# Patient Record
Sex: Male | Born: 1985 | Race: White | Hispanic: No | Marital: Single | Smoking: Current every day smoker
Health system: Southern US, Community
[De-identification: ages and names within clinical notes are randomized; demographics above are authoritative.]

## PROBLEM LIST (undated history)

## (undated) DIAGNOSIS — J45909 Unspecified asthma, uncomplicated: Secondary | ICD-10-CM

## (undated) DIAGNOSIS — W3400XA Accidental discharge from unspecified firearms or gun, initial encounter: Secondary | ICD-10-CM

## (undated) HISTORY — PX: FINGER SURGERY: SHX640

---
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
## 2006-05-28 ENCOUNTER — Emergency Department (HOSPITAL_COMMUNITY): Admission: EM | Admit: 2006-05-28 | Discharge: 2006-05-28 | Payer: Self-pay | Admitting: Emergency Medicine

## 2006-05-31 ENCOUNTER — Emergency Department (HOSPITAL_COMMUNITY): Admission: EM | Admit: 2006-05-31 | Discharge: 2006-05-31 | Payer: Self-pay | Admitting: Emergency Medicine

## 2006-07-20 ENCOUNTER — Emergency Department (HOSPITAL_COMMUNITY): Admission: EM | Admit: 2006-07-20 | Discharge: 2006-07-20 | Payer: Self-pay | Admitting: Emergency Medicine

## 2006-07-22 ENCOUNTER — Emergency Department (HOSPITAL_COMMUNITY): Admission: EM | Admit: 2006-07-22 | Discharge: 2006-07-22 | Payer: Self-pay | Admitting: Family Medicine

## 2006-07-22 ENCOUNTER — Emergency Department (HOSPITAL_COMMUNITY): Admission: EM | Admit: 2006-07-22 | Discharge: 2006-07-22 | Payer: Self-pay | Admitting: Emergency Medicine

## 2007-07-17 ENCOUNTER — Emergency Department (HOSPITAL_COMMUNITY): Admission: EM | Admit: 2007-07-17 | Discharge: 2007-07-18 | Payer: Self-pay | Admitting: Emergency Medicine

## 2007-11-23 ENCOUNTER — Emergency Department (HOSPITAL_COMMUNITY): Admission: EM | Admit: 2007-11-23 | Discharge: 2007-11-23 | Payer: Self-pay | Admitting: Family Medicine

## 2008-01-17 ENCOUNTER — Emergency Department (HOSPITAL_COMMUNITY): Admission: EM | Admit: 2008-01-17 | Discharge: 2008-01-17 | Payer: Self-pay | Admitting: Family Medicine

## 2008-05-16 ENCOUNTER — Emergency Department (HOSPITAL_COMMUNITY): Admission: EM | Admit: 2008-05-16 | Discharge: 2008-05-17 | Payer: Self-pay | Admitting: Emergency Medicine

## 2010-08-19 ENCOUNTER — Emergency Department: Payer: Self-pay | Admitting: Unknown Physician Specialty

## 2010-08-26 ENCOUNTER — Emergency Department: Payer: Self-pay | Admitting: Emergency Medicine

## 2010-11-13 NOTE — Op Note (Signed)
[facility]  Patient:    Frank Mann, Frank Mann Visit Number: [id number] MRN: [id number]          Service Type: DSU Location: [facility] Attending Physician:  Ronne Binning Dictated by:   Nicki Reaper, M.D. Proc. Date: 09/29/01 Admit Date:  09/29/2001   CC:         (2)   Operative Report  PREOPERATIVE DIAGNOSIS: Laceration of left ring finger.  POSTOPERATIVE DIAGNOSIS: Laceration of left ring finger.  OPERATION/PROCEDURE: Repair of ulnar digital artery and nerve, left ring finger.  SURGEON: Nicki Reaper, M.D.  ANESTHESIA: General.  ANESTHESIOLOGIST: Janetta Hora. Gelene Mink, M.D.  HISTORY: The patient is a 25 year old male, who suffered a laceration to the ulnar aspect of the metacarpophalangeal joint area of his left ring finger. Complains of numbness and tingling distally.  DESCRIPTION OF PROCEDURE: The patient was brought to the operating room, where a general anesthetic was carried out without difficulty.  He was prepped and draped using Betadine scrubbing solution with the left arm free.  A tourniquet placed high on the arm was inflated to 250 mm Hg after exsanguinating the limb with an Esmarch bandage.  The wound was opened.  A laceration to the digital artery and nerve were confirmed.  The flexor tendons were intact.  The operative microscope was brought into position and the ends of the artery cut back to normal intima, irrigated, dilated, back wall first technique used for repair with interrupted 9-0 nylon suture.  The nerve was repaired, again with interrupted 9-0 nylon suture, aligning fascicles.  The wound was irrigated. The skin was closed with interrupted 5-0 nylon suture.  A sterile compressive dressing and splint to the ring and little finger were applied.  The patient tolerated the procedure well and was taken to the recovery room for observation in satisfactory condition.  He is discharged home to return to the [facility]  [place] in one week, on Vicodin and Keflex. Dictated by:   Nicki Reaper, M.D. Attending Physician:  Ronne Binning DD:  09/29/01 TD:  09/30/01 Job: [id number]

## 2011-03-24 LAB — POCT URINALYSIS DIP (DEVICE)
Operator id: 247071
Protein, ur: 30 — AB
Urobilinogen, UA: 2 — ABNORMAL HIGH

## 2011-08-03 ENCOUNTER — Emergency Department (HOSPITAL_COMMUNITY)
Admission: EM | Admit: 2011-08-03 | Discharge: 2011-08-03 | Disposition: A | Discharge status: Home / Self Care | Payer: Self-pay | Attending: Emergency Medicine | Admitting: Emergency Medicine

## 2011-08-03 ENCOUNTER — Encounter (HOSPITAL_COMMUNITY): Payer: Self-pay

## 2011-08-03 ENCOUNTER — Other Ambulatory Visit: Payer: Self-pay

## 2011-08-03 ENCOUNTER — Emergency Department (HOSPITAL_COMMUNITY): Payer: Self-pay

## 2011-08-03 DIAGNOSIS — M940 Chondrocostal junction syndrome [Tietze]: Secondary | ICD-10-CM | POA: Insufficient documentation

## 2011-08-03 DIAGNOSIS — R112 Nausea with vomiting, unspecified: Secondary | ICD-10-CM | POA: Insufficient documentation

## 2011-08-03 DIAGNOSIS — F17200 Nicotine dependence, unspecified, uncomplicated: Secondary | ICD-10-CM | POA: Insufficient documentation

## 2011-08-03 DIAGNOSIS — R0602 Shortness of breath: Secondary | ICD-10-CM | POA: Insufficient documentation

## 2011-08-03 DIAGNOSIS — R05 Cough: Secondary | ICD-10-CM | POA: Insufficient documentation

## 2011-08-03 DIAGNOSIS — R079 Chest pain, unspecified: Secondary | ICD-10-CM | POA: Insufficient documentation

## 2011-08-03 DIAGNOSIS — R42 Dizziness and giddiness: Secondary | ICD-10-CM | POA: Insufficient documentation

## 2011-08-03 DIAGNOSIS — R059 Cough, unspecified: Secondary | ICD-10-CM | POA: Insufficient documentation

## 2011-08-03 LAB — D-DIMER, QUANTITATIVE: D-Dimer, Quant: 0.22 ug/mL-FEU (ref 0.00–0.48)

## 2011-08-03 MED ORDER — IBUPROFEN 800 MG PO TABS: 800.0000 mg | ORAL_TABLET | Freq: Three times a day (TID) | ORAL | Status: AC

## 2011-08-03 MED ORDER — ASPIRIN 81 MG PO CHEW
324.0000 mg | CHEWABLE_TABLET | Freq: Once | ORAL | Status: AC
  Administered 2011-08-03: 324 mg via ORAL
  Filled 2011-08-03: qty 4

## 2011-08-03 NOTE — ED Notes (Signed)
Pt st's he has been having mid to left chest pain for few days.  St's nausea and vomiting yesterday.  Mid chest tender to palpation.   Pt eating potato chips, prior to being triaged.

## 2011-08-03 NOTE — ED Notes (Signed)
Pt sitting in chair, wife and child on stretcher warm blankets given

## 2011-08-03 NOTE — ED Provider Notes (Signed)
Medical screening examination/treatment/procedure(s) were performed by non-physician practitioner and as supervising physician I was immediately available for consultation/collaboration.   Loren Racer, MD 08/03/11 [phone]

## 2011-08-03 NOTE — ED Notes (Signed)
Pt. Woke up in the middle of the night with anterior chest pain and severe rt. Rib pain,  3 days ago.  Presently, pt. Continues to have anterior chest heaviness but reports that the rt. Rib pain has decreased, pt. Reports a having blurred vision and dizziness. , pt. Was also having n/v and sob.

## 2011-08-03 NOTE — ED Notes (Signed)
Ranae Palms, MD signed EKGs

## 2011-08-03 NOTE — ED Provider Notes (Signed)
History     CSN: [id number]  Arrival date & time 08/03/11  1738   First MD Initiated Contact with Patient 08/03/11 2119      Chief Complaint  Patient presents with  . Chest Pain    (Consider location/radiation/quality/duration/timing/severity/associated sxs/prior treatment) Patient is a 26 y.o. male presenting with chest pain. The history is provided by the patient and the spouse. No language interpreter was used.  Chest Pain The chest pain began 3 - 5 days ago. Chest pain occurs constantly. The chest pain is unchanged. The pain is associated with breathing and coughing. At its most intense, the pain is at 6/10. The pain is currently at 6/10. The severity of the pain is moderate. The quality of the pain is described as heavy and pressure-like. The pain does not radiate. Chest pain is worsened by deep breathing. Primary symptoms include shortness of breath, nausea, vomiting and dizziness. Pertinent negatives for primary symptoms include no fever, no fatigue, no syncope, no cough, no wheezing, no palpitations, no abdominal pain and no altered mental status.  Dizziness also occurs with nausea and vomiting. Dizziness does not occur with weakness or diaphoresis.   Pertinent negatives for associated symptoms include no claudication, no diaphoresis, no lower extremity edema, no near-syncope, no numbness, no orthopnea, no paroxysmal nocturnal dyspnea and no weakness. He tried nothing for the symptoms. Risk factors include male gender and smoking/tobacco exposure.  Pertinent negatives for past medical history include no CAD, no diabetes, no hyperlipidemia, no hypertension and no MI.  His family medical history is significant for CAD in family, heart disease in family and hypertension in family.     History reviewed. No pertinent past medical history.  History reviewed. No pertinent past surgical history.  No family history on file.  History  Substance Use Topics  . Smoking status: Current  Everyday Smoker  . Smokeless tobacco: Not on file  . Alcohol Use: No      Review of Systems  Constitutional: Negative for fever, diaphoresis and fatigue.  Respiratory: Positive for shortness of breath. Negative for cough and wheezing.   Cardiovascular: Positive for chest pain. Negative for palpitations, orthopnea, claudication, syncope and near-syncope.  Gastrointestinal: Positive for nausea and vomiting. Negative for abdominal pain.  Neurological: Positive for dizziness. Negative for weakness and numbness.  Psychiatric/Behavioral: Negative for altered mental status.    Allergies  Review of patient's allergies indicates no known allergies.  Home Medications   Current Outpatient Rx  Name Route Sig Dispense Refill  . DIPHENHYDRAMINE-APAP (SLEEP) 25-500 MG PO TABS Oral Take 4 tablets by mouth at bedtime.      BP 117/70  Pulse 88  Temp 98.3 F (36.8 C)  Resp 20  Ht 5\' 6"  (1.676 m)  Wt 208 lb (94.348 kg)  BMI 33.57 kg/m2  SpO2 97%  Physical Exam  Nursing note and vitals reviewed. Constitutional: He is oriented to person, place, and time. He appears well-developed and well-nourished. No distress.  HENT:  Head: Normocephalic and atraumatic.  Right Ear: External ear normal.  Left Ear: External ear normal.  Nose: Nose normal.  Mouth/Throat: Oropharynx is clear and moist. No oropharyngeal exudate.  Eyes: Conjunctivae are normal. Pupils are equal, round, and reactive to light. No scleral icterus.  Neck: Normal range of motion. Neck supple.  Cardiovascular: Normal rate, regular rhythm and normal heart sounds.  Exam reveals no gallop and no friction rub.   No murmur heard. Pulmonary/Chest: Effort normal and breath sounds normal. No respiratory distress. He has  no wheezes. He has no rales. He exhibits tenderness.    Abdominal: Soft. Bowel sounds are normal. He exhibits no distension. There is no tenderness.  Musculoskeletal: Normal range of motion. He exhibits no edema and no  tenderness.  Lymphadenopathy:    He has no cervical adenopathy.  Neurological: He is alert and oriented to person, place, and time. No cranial nerve deficit.  Skin: Skin is warm and dry. No rash noted. No erythema. No pallor.  Psychiatric: He has a normal mood and affect. His behavior is normal. Judgment and thought content normal.    ED Course  Procedures (including critical care time)   Labs Reviewed  D-DIMER, QUANTITATIVE   Dg Chest 2 View  08/03/2011  *RADIOLOGY REPORT*  Clinical Data: Chest pain, cough  CHEST - 2 VIEW  Comparison: 07/18/2007  Findings: Lungs are clear. No pleural effusion or pneumothorax.  Cardiomediastinal silhouette is within normal limits.  Visualized osseous structures are within normal limits.  IMPRESSION: Normal chest radiographs.  Original Report Authenticated By: Charline Bills, M.D.   Results for orders placed during the hospital encounter of 08/03/11  D-DIMER, QUANTITATIVE      Component Value Range   D-Dimer, Quant 0.22  0.00 - 0.48 (ug/mL-FEU)  POCT I-STAT TROPONIN I      Component Value Range   Troponin i, poc 0.00  0.00 - 0.08 (ng/mL)   Comment 3            Dg Chest 2 View  08/03/2011  *RADIOLOGY REPORT*  Clinical Data: Chest pain, cough  CHEST - 2 VIEW  Comparison: 07/18/2007  Findings: Lungs are clear. No pleural effusion or pneumothorax.  Cardiomediastinal silhouette is within normal limits.  Visualized osseous structures are within normal limits.  IMPRESSION: Normal chest radiographs.  Original Report Authenticated By: Charline Bills, M.D.     Date: 08/03/2011  Rate: 83  Rhythm: sinus arrhythmia  QRS Axis: normal  Intervals: normal  ST/T Wave abnormalities: normal  Conduction Disutrbances:none  Narrative Interpretation: Reviewed by Dr. Ranae Palms  Old EKG Reviewed: unchanged    Costochondritis   MDM  Patient with minimal risk factor with sternal border tenderness to palpation suggestive of costochondritis - will place on  anti-inflammatories.          Izola Price [place], [place] 08/03/11 2318

## 2011-11-26 ENCOUNTER — Encounter (HOSPITAL_COMMUNITY): Payer: Self-pay | Admitting: Emergency Medicine

## 2011-11-26 ENCOUNTER — Emergency Department (HOSPITAL_COMMUNITY)
Admission: EM | Admit: 2011-11-26 | Discharge: 2011-11-26 | Disposition: A | Discharge status: Home / Self Care | Payer: Self-pay | Attending: Emergency Medicine | Admitting: Emergency Medicine

## 2011-11-26 ENCOUNTER — Emergency Department (HOSPITAL_COMMUNITY): Payer: Self-pay

## 2011-11-26 DIAGNOSIS — M25519 Pain in unspecified shoulder: Secondary | ICD-10-CM | POA: Insufficient documentation

## 2011-11-26 DIAGNOSIS — S43409A Unspecified sprain of unspecified shoulder joint, initial encounter: Secondary | ICD-10-CM

## 2011-11-26 DIAGNOSIS — W208XXA Other cause of strike by thrown, projected or falling object, initial encounter: Secondary | ICD-10-CM | POA: Insufficient documentation

## 2011-11-26 DIAGNOSIS — IMO0002 Reserved for concepts with insufficient information to code with codable children: Secondary | ICD-10-CM | POA: Insufficient documentation

## 2011-11-26 MED ORDER — IBUPROFEN 800 MG PO TABS: 800.0000 mg | ORAL_TABLET | Freq: Three times a day (TID) | ORAL | Status: AC

## 2011-11-26 MED ORDER — TRAMADOL HCL 50 MG PO TABS
50.0000 mg | ORAL_TABLET | Freq: Once | ORAL | Status: AC
  Administered 2011-11-26: 50 mg via ORAL
  Filled 2011-11-26: qty 1

## 2011-11-26 MED ORDER — TRAMADOL HCL 50 MG PO TABS: 50.0000 mg | ORAL_TABLET | Freq: Four times a day (QID) | ORAL | Status: AC | PRN

## 2011-11-26 MED ORDER — IBUPROFEN 800 MG PO TABS
800.0000 mg | ORAL_TABLET | Freq: Once | ORAL | Status: AC
  Administered 2011-11-26: 800 mg via ORAL
  Filled 2011-11-26: qty 1

## 2011-11-26 NOTE — ED Notes (Signed)
Pt denies any questions,  Verbalized understanding of no working or driving with prescriptions meds.

## 2011-11-26 NOTE — ED Notes (Signed)
PT. REPORTS RIGHT SHOULDER PAIN , STATES TIRE FELL ON HIM WHILE WORKING UNDER A VEHICLE THIS MORNING . PAIN WORSE WITH MOVEMENT .

## 2011-11-26 NOTE — ED Notes (Signed)
Ortho tech informed of sling immobilizer ordered for right shoulder pain

## 2011-11-26 NOTE — Discharge Instructions (Signed)
Wear shoulder sling for the next few days for comfort. Ice shoulder throughout the day. Alternate between ibuprofen and ultram for pain relief. Do not drive or operate machinery with ultram use. Call orthopedic follow up  tomorrow to schedule followup appointment for recheck of ongoing shoulder pain that can be canceled with a 24-48 hour notice if complete resolution of pain.

## 2011-11-26 NOTE — ED Provider Notes (Signed)
History     CSN: [id number]  Arrival date & time 11/26/11  1959   First MD Initiated Contact with Patient 11/26/11 2052      Chief Complaint  Patient presents with  . Shoulder Pain    (Consider location/radiation/quality/duration/timing/severity/associated sxs/prior treatment) HPI  Patient presents to emergency department complaining of right shoulder injury that happened this morning at 9 AM when he states that he was lying beneath a truck that was up on a jack and the jack came loose causing the tire to fall down striking his right shoulder. Patient states he felt and heard a loud pop. Patient states that since then the shoulder has been painful especially with movement. Patient states that he took a tramadol from one of his friends with complete temporary resolution of pain however states that within a few hours the pain returned. Patient states the pain is alleviated when he keeps his shoulder abducted and flexed at the elbow. Patient states that he has no known medical problems and takes no medicines on regular basis. After taking the Tramadol earlier today he has taken no other medication for pain. He denies numbness/tingling/or extremity weakness. He denies break in skin. He denies additional injury denying chest pain, abdominal pain, nausea, vomiting, hitting his head or loss of consciousness.  History reviewed. No pertinent past medical history.  Past Surgical History  Procedure Date  . Finger surgery     No family history on file.  History  Substance Use Topics  . Smoking status: Current Everyday Smoker  . Smokeless tobacco: Not on file  . Alcohol Use: No      Review of Systems  All other systems reviewed and are negative.    Allergies  Review of patient's allergies indicates no known allergies.  Home Medications   Current Outpatient Rx  Name Route Sig Dispense Refill  . TRAMADOL HCL 50 MG PO TABS Oral Take 100 mg by mouth every 6 (six) hours as needed. For  pain.      BP 130/86  Pulse 112  Temp(Src) 98.7 F (37.1 C) (Oral)  Resp 20  SpO2 97%  Physical Exam  Nursing note and vitals reviewed. Constitutional: He is oriented to person, place, and time. He appears well-developed and well-nourished. No distress.  HENT:  Head: Normocephalic and atraumatic.  Eyes: Conjunctivae are normal.  Neck: Normal range of motion. Neck supple.  Cardiovascular: Normal rate, regular rhythm, normal heart sounds and intact distal pulses.  Exam reveals no gallop and no friction rub.   No murmur heard. Pulmonary/Chest: Effort normal and breath sounds normal. No respiratory distress. He has no wheezes. He has no rales. He exhibits no tenderness.  Abdominal: Soft. Bowel sounds are normal. He exhibits no distension and no mass. There is no tenderness. There is no rebound and no guarding.  Musculoskeletal: Normal range of motion. He exhibits tenderness. He exhibits no edema.       TTP of entire right shoulder with pain with ROM but no crepitous or deformity. No break in skin, bruising or redness. No TTP of anterior chest wall or posterior shoulder along scapula. No TTP of upper arm, elbow, forearm or hand. Normal sensation of entire RUE with good radial pulse and cap refill.   Neurological: He is alert and oriented to person, place, and time.  Skin: Skin is warm and dry. No rash noted. He is not diaphoretic. No erythema.  Psychiatric: He has a normal mood and affect.    ED Course  Procedures (  including critical care time)  PO tramadol and ibuprofen. Sling immobilizer   Labs Reviewed - No data to display Dg Shoulder Right  11/26/2011  *RADIOLOGY REPORT*  Clinical Data: shoulder pain  RIGHT SHOULDER - 2+ VIEW  Comparison: None  Findings: There is no evidence of fracture or dislocation.  There is no evidence of arthropathy or other focal bone abnormality. Soft tissues are unremarkable.  IMPRESSION: Negative exam.  Original Report Authenticated By: Rosealee Albee,  M.D.     1. Shoulder sprain       MDM  RUE neurovasc intact with no acute findings on shoulder xray. No other injury indicated denying hitting heat or LOC. Chest and abdomen non tender. denies impact to chest or abdomen.         Drucie Opitz, [place] 11/26/11 2133

## 2011-11-27 NOTE — ED Provider Notes (Signed)
Medical screening examination/treatment/procedure(s) were performed by non-physician practitioner and as supervising physician I was immediately available for consultation/collaboration.   Loren Racer, MD 11/27/11 [phone]

## 2012-08-08 ENCOUNTER — Emergency Department (HOSPITAL_COMMUNITY)
Admission: EM | Admit: 2012-08-08 | Discharge: 2012-08-08 | Disposition: A | Discharge status: Home / Self Care | Payer: Self-pay | Attending: Emergency Medicine | Admitting: Emergency Medicine

## 2012-08-08 ENCOUNTER — Encounter (HOSPITAL_COMMUNITY): Payer: Self-pay | Admitting: Vascular Surgery

## 2012-08-08 ENCOUNTER — Emergency Department (HOSPITAL_COMMUNITY): Payer: Self-pay

## 2012-08-08 DIAGNOSIS — J45909 Unspecified asthma, uncomplicated: Secondary | ICD-10-CM | POA: Insufficient documentation

## 2012-08-08 DIAGNOSIS — F17200 Nicotine dependence, unspecified, uncomplicated: Secondary | ICD-10-CM | POA: Insufficient documentation

## 2012-08-08 DIAGNOSIS — R079 Chest pain, unspecified: Secondary | ICD-10-CM | POA: Insufficient documentation

## 2012-08-08 DIAGNOSIS — M109 Gout, unspecified: Secondary | ICD-10-CM | POA: Insufficient documentation

## 2012-08-08 DIAGNOSIS — R0602 Shortness of breath: Secondary | ICD-10-CM | POA: Insufficient documentation

## 2012-08-08 DIAGNOSIS — Z79899 Other long term (current) drug therapy: Secondary | ICD-10-CM | POA: Insufficient documentation

## 2012-08-08 HISTORY — DX: Unspecified asthma, uncomplicated: J45.909

## 2012-08-08 LAB — CBC WITH DIFFERENTIAL/PLATELET
Basophils Relative: 0 % (ref 0–1)
Eosinophils Relative: 2 % (ref 0–5)
HCT: 43.7 % (ref 39.0–52.0)
Lymphs Abs: 2.6 10*3/uL (ref 0.7–4.0)
MCH: 30.2 pg (ref 26.0–34.0)
MCV: 86.9 fL (ref 78.0–100.0)
Monocytes Absolute: 0.9 10*3/uL (ref 0.1–1.0)
Platelets: 464 10*3/uL — ABNORMAL HIGH (ref 150–400)
RDW: 12.9 % (ref 11.5–15.5)

## 2012-08-08 LAB — COMPREHENSIVE METABOLIC PANEL
AST: 25 U/L (ref 0–37)
Albumin: 3.9 g/dL (ref 3.5–5.2)
Alkaline Phosphatase: 87 U/L (ref 39–117)
BUN: 7 mg/dL (ref 6–23)
CO2: 27 mEq/L (ref 19–32)
Chloride: 99 mEq/L (ref 96–112)
GFR calc non Af Amer: >90 mL/min (ref >90)
Potassium: 4.5 mEq/L (ref 3.5–5.1)
Total Bilirubin: 0.2 mg/dL — ABNORMAL LOW (ref 0.3–1.2)

## 2012-08-08 MED ORDER — INDOMETHACIN 25 MG PO CAPS: 25.0000 mg | ORAL_CAPSULE | Freq: Three times a day (TID) | ORAL | Status: DC | PRN

## 2012-08-08 MED ORDER — ALBUTEROL SULFATE HFA 108 (90 BASE) MCG/ACT IN AERS: 1.0000 | INHALATION_SPRAY | Freq: Four times a day (QID) | RESPIRATORY_TRACT | Status: DC | PRN

## 2012-08-08 NOTE — ED Provider Notes (Signed)
History     CSN: [id number]  Arrival date & time 08/08/12  1255   First MD Initiated Contact with Patient 08/08/12 1444      Chief Complaint  Patient presents with  . Foot Swelling    (Consider location/radiation/quality/duration/timing/severity/associated sxs/prior treatment) Patient is a 27 y.o. male presenting with foot injury. The history is provided by the patient and a friend.  Foot Injury Lower extremity pain location: dorsum of right foot. Time since incident:  3 days Pain details:    Quality: sharp when touched, bearing weight or moved, dull ache when resting.   Radiates to:  Does not radiate   Severity:  Severe   Onset quality:  Sudden   Duration:  3 days   Timing:  Constant   Progression:  Worsening Chronicity:  New Dislocation: no   Foreign body present:  No foreign bodies Relieved by:  Rest and immobilization Worsened by:  Activity, bearing weight and flexion Ineffective treatments:  Acetaminophen Associated symptoms: no fever and no neck pain   Associated symptoms comment:  None Risk factors: no concern for non-accidental trauma and no recent illness     Past Medical History  Diagnosis Date  . Asthma     Past Surgical History  Procedure Laterality Date  . Finger surgery      History reviewed. No pertinent family history.  History  Substance Use Topics  . Smoking status: Current Every Day Smoker -- 0.50 packs/day    Types: Cigarettes  . Smokeless tobacco: Not on file  . Alcohol Use: Yes     Comment: 1-2 times per week      Review of Systems  Constitutional: Negative for fever and diaphoresis.  HENT: Negative for neck pain and neck stiffness.   Eyes: Negative for visual disturbance.  Respiratory: Negative for apnea, chest tightness and shortness of breath.   Cardiovascular: Negative for chest pain and palpitations.  Gastrointestinal: Negative for nausea, vomiting, diarrhea and constipation.  Genitourinary: Negative for dysuria.   Musculoskeletal: Positive for gait problem.       Pain and swelling with flexion and wt bearing of right foot  Skin: Negative for color change, rash and wound.       No erythema  Neurological: Negative for dizziness, weakness, light-headedness, numbness and headaches.    Allergies  Review of patient's allergies indicates no known allergies.  Home Medications   Current Outpatient Rx  Name  Route  Sig  Dispense  Refill  . ibuprofen (ADVIL,MOTRIN) 200 MG tablet   Oral   Take 1,200-1,600 mg by mouth every 6 (six) hours as needed for pain.         Marland Kitchen albuterol (PROVENTIL HFA;VENTOLIN HFA) 108 (90 BASE) MCG/ACT inhaler   Inhalation   Inhale 1-2 puffs into the lungs every 6 (six) hours as needed for wheezing.   1 Inhaler   0   . indomethacin (INDOCIN) 25 MG capsule   Oral   Take 1 capsule (25 mg total) by mouth 3 (three) times daily as needed.   30 capsule   0     BP 122/78  Pulse 82  Temp(Src) 97.8 F (36.6 C) (Oral)  Resp 18  SpO2 100%  Physical Exam  Nursing note and vitals reviewed. Constitutional: He is oriented to person, place, and time. He appears well-developed and well-nourished. No distress.  HENT:  Head: Normocephalic and atraumatic.  Right Ear: External ear normal.  Left Ear: External ear normal.  No tophi  Eyes: EOM are normal.  Pupils are equal, round, and reactive to light.  Neck: Normal range of motion. Neck supple.  No meningeal signs  Cardiovascular: Normal rate, regular rhythm, normal heart sounds and intact distal pulses.  Exam reveals no gallop and no friction rub.   No murmur heard. Pulmonary/Chest: Effort normal and breath sounds normal. No respiratory distress. He has no wheezes. He has no rales. He exhibits no tenderness.  Abdominal: Soft. Bowel sounds are normal. He exhibits no distension. There is no tenderness. There is no rebound and no guarding.  Musculoskeletal: He exhibits edema and tenderness.  Exquisitely tender dorsum of right foot  proximal to great toe. Swelling. No bruising, deformity, warmth, erythema, demarcation, induration, rash. Limited ROM due to pain. Unable to bear weight on injured extremity.   Neurological: He is alert and oriented to person, place, and time. No cranial nerve deficit.  No focal deficits. Sensitivity to light touch intact.  Skin: Skin is warm and dry. He is not diaphoretic. No erythema.    ED Course  Procedures (including critical care time)  Labs Reviewed  CBC WITH DIFFERENTIAL - Abnormal; Notable for the following:    WBC 12.9 (*)    Platelets 464 (*)    Neutro Abs 9.0 (*)    All other components within normal limits  COMPREHENSIVE METABOLIC PANEL - Abnormal; Notable for the following:    Total Bilirubin 0.2 (*)    All other components within normal limits   Dg Ankle Complete Right  08/08/2012  *RADIOLOGY REPORT*  Clinical Data: Ankle pain and swelling.  RIGHT ANKLE - COMPLETE 3+ VIEW  Comparison: None.  Findings: No fracture or dislocation is noted.  Joint spaces are intact. No soft tissue abnormality is noted.  IMPRESSION: Normal right ankle.   Original Report Authenticated By: Lupita Raider.,  M.D.    Dg Foot Complete Right  08/08/2012  *RADIOLOGY REPORT*  Clinical Data: Foot pain  RIGHT FOOT COMPLETE - 3+ VIEW  Comparison: None.  Findings: Three views of the right foot submitted.  No acute fracture or subluxation.  No radiopaque foreign body.  IMPRESSION: No acute fracture or subluxation.   Original Report Authenticated By: Natasha Mead, M.D.      1. Gout flare       MDM  Location of swelling and tenderness is exclusive of direct joint involvement, manifesting predominately on the dorsum of the right foot proximal to the great toe. Imaging shows no fracture or evidence of osteomyelitis. No rash, demarcation, induration. Considering cellulitis vs. Gout vs. Septic arthritis. Pt recalls no recent trauma, no recent surgery, no DM, no CKD. Pt denies fever. The area is not  discriminantly warm or erythematous. Pt denies any hx of STDs or rheumatoid arthritis. The exquisite tenderness (pt could barely tolerate my brushing a gloved hand against the skin), pt admitting to a recent increased intake of beer and red meat is suggestive of gout.   Prescribed indomethacin, provided crutches and post-op shoe for comfort.  Discussed with pt the importance of following up with a PCP. Provided resource list.  At this time there does not appear to be any evidence of an acute emergency medical condition and the patient appears stable for discharge. Diagnosis was discussed with patient who verbalizes understanding and is agreeable to discharge.   Glade Nurse, PA-C 08/08/12 2039

## 2012-08-08 NOTE — Progress Notes (Signed)
Orthopedic Tech Progress Note Patient Details:  Frank Mann 1985/10/29 [id number] (R) LE Ortho Devices Type of Ortho Device: Postop shoe/boot;Crutches Ortho Device/Splint Interventions: Application   Jennye Moccasin 08/08/2012, 4:04 PM

## 2012-08-08 NOTE — ED Notes (Signed)
Paged ortho and they stated they were on the way.

## 2012-08-08 NOTE — Progress Notes (Signed)
Orthopedic Tech Progress Note Patient Details:  Frank Mann [birth year] [id number]  Ortho Devices Type of Ortho Device: Postop shoe/boot;Crutches Ortho Device/Splint Interventions: Application   Jennye Moccasin 08/08/2012, 4:04 PM

## 2012-08-08 NOTE — ED Notes (Signed)
Pt reports to the ED for r/o DM. Pt reports multiple symptoms. Pt reports that over the past few months he has had intermittent CP and SOB. Pt denies any CP at this time but does reports SOB. Pt reports that he had been using his sons. Inhaler but he no longer has access to it. Pt also reports that his vision will sometimes get blurry and he will get dizzy and weak if he doesn't eat all day and that when he eats something he feels better. Pt reports that the last time he ate something was around 2230 last pm. Pt also reports that his left foot is swollen x 3 days and it is painful to place pressure on the foot. Pt reports he does not think he injured his foot. Pt also reports erectile dysfunction x 6 months. CMS intact in bilateral feet. Some swelling but no deformity noted. Pts ROM in affected foot is limited by pain.

## 2012-08-09 NOTE — ED Provider Notes (Signed)
Medical screening examination/treatment/procedure(s) were performed by non-physician practitioner and as supervising physician I was immediately available for consultation/collaboration.   Charles B. Bernette Mayers, MD 08/09/12 1011

## 2012-08-30 ENCOUNTER — Emergency Department (HOSPITAL_COMMUNITY)
Admission: EM | Admit: 2012-08-30 | Discharge: 2012-08-31 | Disposition: A | Discharge status: Home / Self Care | Payer: Self-pay | Attending: Emergency Medicine | Admitting: Emergency Medicine

## 2012-08-30 ENCOUNTER — Encounter (HOSPITAL_COMMUNITY): Payer: Self-pay | Admitting: Emergency Medicine

## 2012-08-30 DIAGNOSIS — R05 Cough: Secondary | ICD-10-CM | POA: Insufficient documentation

## 2012-08-30 DIAGNOSIS — R0602 Shortness of breath: Secondary | ICD-10-CM | POA: Insufficient documentation

## 2012-08-30 DIAGNOSIS — F17200 Nicotine dependence, unspecified, uncomplicated: Secondary | ICD-10-CM | POA: Insufficient documentation

## 2012-08-30 DIAGNOSIS — K089 Disorder of teeth and supporting structures, unspecified: Secondary | ICD-10-CM | POA: Insufficient documentation

## 2012-08-30 DIAGNOSIS — R059 Cough, unspecified: Secondary | ICD-10-CM | POA: Insufficient documentation

## 2012-08-30 DIAGNOSIS — J40 Bronchitis, not specified as acute or chronic: Secondary | ICD-10-CM

## 2012-08-30 DIAGNOSIS — K0889 Other specified disorders of teeth and supporting structures: Secondary | ICD-10-CM

## 2012-08-30 DIAGNOSIS — J45901 Unspecified asthma with (acute) exacerbation: Secondary | ICD-10-CM | POA: Insufficient documentation

## 2012-08-30 NOTE — ED Notes (Signed)
PT. REPORTS PROGRESSING RIGHT LOWER MOLAR PAIN / SWELLING FOR SEVERAL DAYS UNRELIEVED BY OTC PAIN MEDICATIONS .

## 2012-08-31 ENCOUNTER — Emergency Department (HOSPITAL_COMMUNITY): Payer: Self-pay

## 2012-08-31 MED ORDER — HYDROCODONE-ACETAMINOPHEN 5-325 MG PO TABS: 1.0000 | ORAL_TABLET | ORAL | Status: DC | PRN

## 2012-08-31 MED ORDER — ALBUTEROL SULFATE HFA 108 (90 BASE) MCG/ACT IN AERS
2.0000 | INHALATION_SPRAY | RESPIRATORY_TRACT | Status: DC | PRN
  Administered 2012-08-31: 2 via RESPIRATORY_TRACT
  Filled 2012-08-31: qty 6.7

## 2012-08-31 MED ORDER — PENICILLIN V POTASSIUM 500 MG PO TABS: 500.0000 mg | ORAL_TABLET | Freq: Three times a day (TID) | ORAL | Status: DC

## 2012-08-31 NOTE — ED Provider Notes (Signed)
History     CSN: [id number]  Arrival date & time 08/30/12  2348   First MD Initiated Contact with Patient 08/31/12 0034      Chief Complaint  Patient presents with  . Dental Pain    (Consider location/radiation/quality/duration/timing/severity/associated sxs/prior treatment) HPI History provided by pt.   Pt presents to ED w/ multiple complaints.  Has had severe pain of right lower teeth w/ radiation to right temple, ear and throat for the past 3 days.  Has been taking tylenol and ibuprofen w/out relief.  No associated fever.  Denies trauma.  Does not have a dentist.  Also c/o cough x 2 weeks.  Associated w/ SOB upon waking and before going to bed and is now productive of blood-streaked sputum.  Denies CP.  No h/o pulmonary disease.  Per prior chart, pt was seen in ED on 2/11 for gout pain and was prescribed an albuterol inhaler at that time.  He can not afford.    Past Medical History  Diagnosis Date  . Asthma     Past Surgical History  Procedure Laterality Date  . Finger surgery      No family history on file.  History  Substance Use Topics  . Smoking status: Current Every Day Smoker -- 0.50 packs/day    Types: Cigarettes  . Smokeless tobacco: Not on file  . Alcohol Use: Yes     Comment: 1-2 times per week      Review of Systems  All other systems reviewed and are negative.    Allergies  Review of patient's allergies indicates no known allergies.  Home Medications   Current Outpatient Rx  Name  Route  Sig  Dispense  Refill  . acetaminophen (TYLENOL) 100 MG/ML solution   Oral   Take by mouth every 4 (four) hours as needed for fever.         Marland Kitchen HYDROcodone-acetaminophen (NORCO/VICODIN) 5-325 MG per tablet   Oral   Take 1 tablet by mouth every 4 (four) hours as needed for pain.   20 tablet   0   . penicillin v potassium (VEETID) 500 MG tablet   Oral   Take 1 tablet (500 mg total) by mouth 3 (three) times daily.   30 tablet   0     BP 129/84  Pulse  62  Temp(Src) 97.6 F (36.4 C) (Oral)  Resp 16  SpO2 100%  Physical Exam  Nursing note and vitals reviewed. Constitutional: He is oriented to person, place, and time. He appears well-developed and well-nourished.  HENT:  Head: Normocephalic and atraumatic. No trismus in the jaw.  Mouth/Throat: Uvula is midline and oropharynx is clear and moist.  Diffuse, advanced dental caries.  Several teeth have rotted down to the gingiva.  Severe tenderness right lower 2nd molar. Adjacent gingiva appears normal.  No edema of buccal mucosa.    Eyes:  Normal appearance  Neck: Normal range of motion. Neck supple.  No submandibular edema  Pulmonary/Chest:  Diffuse expiratory wheezing  Lymphadenopathy:    He has no cervical adenopathy.  Neurological: He is alert and oriented to person, place, and time.  Psychiatric: He has a normal mood and affect. His behavior is normal.    ED Course  Procedures (including critical care time)  Labs Reviewed - No data to display Dg Chest 2 View  08/31/2012  *RADIOLOGY REPORT*  Clinical Data: Short of breath, concern for bronchitis  CHEST - 2 VIEW  Comparison: Chest radiograph 08/03/2011  Findings: Normal  mediastinum and cardiac silhouette.  Normal pulmonary  vasculature.  No evidence of effusion, infiltrate, or pneumothorax.  No acute bony abnormality.  IMPRESSION: Normal mediastinum and cardiac silhouette.  Normal pulmonary vasculature.  No evidence of effusion, infiltrate, or pneumothorax. No acute bony abnormality.   Original Report Authenticated By: Genevive Bi, M.D.      1. Pain, dental   2. Bronchitis       MDM  26yo healthy M presents w/ dental pain.  Advanced dental caries and concern for periapical abscess of right lower second molar on exam.  Pain controlled w/ local anesthesia and pt d/c'd home w/ penicillin, vicodin and referral to dentistry.  Also c/o cough x 2 weeks, no productive of blood-streaked sputum and w/ SOB upon waking and before bed.   Diffuse wheezing on exam.  CXR neg.  Pt received an albuterol inhaler to treat what is likely viral bronchitis.  Return precautions discussed.        Otilio Miu, PA-C 08/31/12 2004

## 2012-08-31 NOTE — ED Notes (Signed)
Pt denies any questions upon discharge. 

## 2012-08-31 NOTE — ED Provider Notes (Signed)
Medical screening examination/treatment/procedure(s) were performed by non-physician practitioner and as supervising physician I was immediately available for consultation/collaboration.  Brian Opitz, MD 08/31/12 2302 

## 2012-09-26 ENCOUNTER — Emergency Department (HOSPITAL_COMMUNITY)
Admission: EM | Admit: 2012-09-26 | Discharge: 2012-09-26 | Disposition: A | Discharge status: Home / Self Care | Payer: Self-pay | Attending: Emergency Medicine | Admitting: Emergency Medicine

## 2012-09-26 DIAGNOSIS — R300 Dysuria: Secondary | ICD-10-CM | POA: Insufficient documentation

## 2012-09-26 DIAGNOSIS — Z79899 Other long term (current) drug therapy: Secondary | ICD-10-CM | POA: Insufficient documentation

## 2012-09-26 DIAGNOSIS — Z202 Contact with and (suspected) exposure to infections with a predominantly sexual mode of transmission: Secondary | ICD-10-CM

## 2012-09-26 DIAGNOSIS — J45909 Unspecified asthma, uncomplicated: Secondary | ICD-10-CM | POA: Insufficient documentation

## 2012-09-26 DIAGNOSIS — F17200 Nicotine dependence, unspecified, uncomplicated: Secondary | ICD-10-CM | POA: Insufficient documentation

## 2012-09-26 DIAGNOSIS — B86 Scabies: Secondary | ICD-10-CM | POA: Insufficient documentation

## 2012-09-26 DIAGNOSIS — R21 Rash and other nonspecific skin eruption: Secondary | ICD-10-CM | POA: Insufficient documentation

## 2012-09-26 DIAGNOSIS — R369 Urethral discharge, unspecified: Secondary | ICD-10-CM | POA: Insufficient documentation

## 2012-09-26 LAB — URINALYSIS, ROUTINE W REFLEX MICROSCOPIC
Bilirubin Urine: NEGATIVE
Glucose, UA: NEGATIVE mg/dL
Hgb urine dipstick: NEGATIVE
Ketones, ur: NEGATIVE mg/dL
Nitrite: NEGATIVE
Protein, ur: NEGATIVE mg/dL
Specific Gravity, Urine: 1.023 (ref 1.005–1.030)
Urobilinogen, UA: 0.2 mg/dL (ref 0.0–1.0)
pH: 6 (ref 5.0–8.0)

## 2012-09-26 LAB — URINE MICROSCOPIC-ADD ON

## 2012-09-26 MED ORDER — LIDOCAINE HCL (PF) 1 % IJ SOLN
INTRAMUSCULAR | Status: AC
  Filled 2012-09-26: qty 5

## 2012-09-26 MED ORDER — ALBUTEROL SULFATE HFA 108 (90 BASE) MCG/ACT IN AERS
1.0000 | INHALATION_SPRAY | Freq: Four times a day (QID) | RESPIRATORY_TRACT | Status: DC
  Administered 2012-09-26: 1 via RESPIRATORY_TRACT
  Filled 2012-09-26: qty 6.7

## 2012-09-26 MED ORDER — AZITHROMYCIN 250 MG PO TABS
1000.0000 mg | ORAL_TABLET | Freq: Every day | ORAL | Status: DC
  Administered 2012-09-26: 1000 mg via ORAL
  Filled 2012-09-26: qty 4

## 2012-09-26 MED ORDER — AZITHROMYCIN 1 G PO PACK
1.0000 g | PACK | Freq: Once | ORAL | Status: DC
  Filled 2012-09-26: qty 1

## 2012-09-26 MED ORDER — PERMETHRIN 1 % EX LOTN: TOPICAL_LOTION | Freq: Once | CUTANEOUS | Status: DC

## 2012-09-26 MED ORDER — CEFTRIAXONE SODIUM 250 MG IJ SOLR
250.0000 mg | Freq: Once | INTRAMUSCULAR | Status: AC
  Administered 2012-09-26: 250 mg via INTRAMUSCULAR
  Filled 2012-09-26: qty 250

## 2012-09-26 NOTE — ED Provider Notes (Signed)
History     CSN: [id number]  Arrival date & time 09/26/12  1505   First MD Initiated Contact with Patient 09/26/12 1600      Chief Complaint  Patient presents with  . Exposure to STD    (Consider location/radiation/quality/duration/timing/severity/associated sxs/prior treatment) Patient is a 27 y.o. male presenting with STD exposure. The history is provided by the patient. No language interpreter was used.  Exposure to STD This is a new problem. The current episode started in the past 7 days. Associated symptoms include a rash. Pertinent negatives include no chills, fever, nausea or vomiting.  Pt presenting to ED requesting tx for exposure to chlamydia.  States ex- wife was just treated for the same and told him she "had it for a while"  Pt c/o discharge from his penis and painful urination. Pt also c/o rash on hands and feet he noticed a few days after adopting a new dog.  Denies fever, n/v/d.  Has not tried anything for dysuria or for his rash.  Rash is pruritic.   Past Medical History  Diagnosis Date  . Asthma     Past Surgical History  Procedure Laterality Date  . Finger surgery      No family history on file.  History  Substance Use Topics  . Smoking status: Current Every Day Smoker -- 0.50 packs/day    Types: Cigarettes  . Smokeless tobacco: Not on file  . Alcohol Use: Yes     Comment: 1-2 times per week      Review of Systems  Constitutional: Negative for fever and chills.  Gastrointestinal: Negative for nausea, vomiting and diarrhea.  Genitourinary: Positive for dysuria and discharge. Negative for frequency, hematuria, flank pain, penile swelling, scrotal swelling, penile pain and testicular pain.  Skin: Positive for rash.    Allergies  Review of patient's allergies indicates no known allergies.  Home Medications   Current Outpatient Rx  Name  Route  Sig  Dispense  Refill  . albuterol (PROVENTIL HFA;VENTOLIN HFA) 108 (90 BASE) MCG/ACT inhaler  Inhalation   Inhale 2 puffs into the lungs every 6 (six) hours as needed for wheezing.         Marland Kitchen HYDROcodone-acetaminophen (NORCO/VICODIN) 5-325 MG per tablet   Oral   Take 1 tablet by mouth every 4 (four) hours as needed for pain.         Marland Kitchen ibuprofen (ADVIL,MOTRIN) 200 MG tablet   Oral   Take 800 mg by mouth every 6 (six) hours as needed for pain.         Marland Kitchen permethrin ([employer] PERMETHRIN) 1 % lotion   Topical   Apply topically once. Shampoo, rinse and towel dry hair, saturate hair and scalp with permethrin. Rinse after 10 min; repeat in 1 week if needed   59 mL   0     BP 144/78  Pulse 99  Temp(Src) 98.2 F (36.8 C) (Oral)  Resp 16  SpO2 98%  Physical Exam  Nursing note and vitals reviewed. Constitutional: He appears well-developed and well-nourished.  HENT:  Head: Normocephalic and atraumatic.  Eyes: Conjunctivae are normal.  Neck: Normal range of motion.  Cardiovascular: Normal rate, regular rhythm and normal heart sounds.   Pulmonary/Chest: Effort normal and breath sounds normal. No respiratory distress. He has no wheezes.  Abdominal: Soft. Bowel sounds are normal. He exhibits no distension. There is no tenderness.  Genitourinary: Penis normal. No penile tenderness.  No penile lesion or discharge. No scrotal lesion or tenderness.  Musculoskeletal: Normal range of motion.  Neurological: He is alert.  Skin: Skin is warm and dry. Rash ( erythemic, diffuse papular rash in webbing of both hands and feet.  rash extends from hands midway up both forewarms. also midway up both lower legs   papules are pinpoint in size. some vesicular looking . withinout wamth. or red streaking) noted.    ED Course  Procedures (including critical care time)  Labs Reviewed  URINALYSIS, ROUTINE W REFLEX MICROSCOPIC - Abnormal; Notable for the following:    Leukocytes, UA MODERATE (*)    All other components within normal limits  URINE MICROSCOPIC-ADD ON - Abnormal; Notable for the  following:    Squamous Epithelial / LPF FEW (*)    Bacteria, UA FEW (*)    All other components within normal limits  GC/CHLAMYDIA PROBE AMP  URINE CULTURE  RPR   No results found.   1. Penile discharge   2. Chlamydia contact   3. Scabies       MDM  Pt presented with known exposure to chlamydia, c/o penile discharge and dysuria for 3 days.  Also c/o rash on hands and feet, noticed a few days after adopting a new dog.  Rash is pruritic.   Will tx pt for chlamydia and gonorrhea due to high likelihood of infection from known exposure. Will obtain swab to still test for GC/clamydia.  Also will run RPR due to known STD exposure associated with papular rash appearing on hands and feet.  Possible syphilis.    Pt described rash as pruritic.  Rash was noticed after exposure to a new animal and lesions are located in webs of fingers and toes.  Likely scabies.    Will tx for scabies but still test for syphilis.  Tx in ED: azithromycin 1gm and rocephin 250mg .    Dx: chlamydia contact, penila discharge, scabies.    Rx: Permethrin   Will have pt follow up with community health clinic to have continued STD follow up and testing.  Pt will be called when results of RPR are back and he will be informed if he needs further tx. Also discussed having other members of household tx for scabies at primary care or urgent care clinic.  Pt understood and agreed with tx plan.    Vitals: unremarkable. Discharged in stable condition.    Discussed pt with attending during ED encounter.         Junius Finner, PA-C 09/27/12 [phone]

## 2012-09-26 NOTE — ED Notes (Signed)
To ED for treatment after being exposed to chlamydia. States his ex-wife was just treated for same and told she 'had it for a while'. Also with rash to hands and feet, discharge from penis, and painful urination

## 2012-09-27 LAB — URINE CULTURE
Colony Count: NO GROWTH
Culture: NO GROWTH

## 2012-09-27 LAB — RPR: RPR Ser Ql: NONREACTIVE

## 2012-09-27 NOTE — ED Provider Notes (Signed)
Medical screening examination/treatment/procedure(s) were performed by non-physician practitioner and as supervising physician I was immediately available for consultation/collaboration.   Glynn Octave, MD 09/27/12 1154

## 2012-09-28 LAB — GC/CHLAMYDIA PROBE AMP
CT Probe RNA: POSITIVE — AB
GC Probe RNA: NEGATIVE

## 2012-09-29 ENCOUNTER — Telehealth (HOSPITAL_COMMUNITY): Payer: Self-pay | Admitting: Emergency Medicine

## 2012-09-29 NOTE — ED Notes (Signed)
Patient has +Chlamydia. Checking to see if appropriately treated. °

## 2012-09-29 NOTE — ED Notes (Signed)
+  Clamydia. Chart sent to EDP office for review. [facility] attached.

## 2012-09-30 ENCOUNTER — Telehealth (HOSPITAL_COMMUNITY): Payer: Self-pay | Admitting: Emergency Medicine

## 2012-09-30 NOTE — ED Notes (Signed)
Looking to see why the azithromycin was "discontinued" in the ED.

## 2012-10-01 ENCOUNTER — Telehealth (HOSPITAL_COMMUNITY): Payer: Self-pay | Admitting: Emergency Medicine

## 2012-10-01 NOTE — ED Notes (Signed)
Chart returned from EDP office. Per [facility] PA-C, treated in ED. No further treatment necessary at this time.

## 2012-11-02 ENCOUNTER — Emergency Department (HOSPITAL_COMMUNITY)
Admission: EM | Admit: 2012-11-02 | Discharge: 2012-11-02 | Disposition: A | Discharge status: Home / Self Care | Payer: Self-pay | Attending: Emergency Medicine | Admitting: Emergency Medicine

## 2012-11-02 ENCOUNTER — Encounter (HOSPITAL_COMMUNITY): Payer: Self-pay | Admitting: *Deleted

## 2012-11-02 DIAGNOSIS — B86 Scabies: Secondary | ICD-10-CM | POA: Insufficient documentation

## 2012-11-02 DIAGNOSIS — F17200 Nicotine dependence, unspecified, uncomplicated: Secondary | ICD-10-CM | POA: Insufficient documentation

## 2012-11-02 DIAGNOSIS — Z79899 Other long term (current) drug therapy: Secondary | ICD-10-CM | POA: Insufficient documentation

## 2012-11-02 DIAGNOSIS — J45909 Unspecified asthma, uncomplicated: Secondary | ICD-10-CM | POA: Insufficient documentation

## 2012-11-02 MED ORDER — DIPHENHYDRAMINE HCL 25 MG PO CAPS
25.0000 mg | ORAL_CAPSULE | Freq: Once | ORAL | Status: AC
  Administered 2012-11-02: 25 mg via ORAL
  Filled 2012-11-02: qty 1

## 2012-11-02 MED ORDER — PERMETHRIN 5 % EX CREA: TOPICAL_CREAM | CUTANEOUS | Status: AC

## 2012-11-02 NOTE — ED Notes (Signed)
Pt c/o itchy rash to the hands, feet, and face x 1 month.  States he was seen and given rx for Nix but not helping.

## 2012-11-02 NOTE — ED Provider Notes (Signed)
History     CSN: [id number]  Arrival date & time 11/02/12  0101   First MD Initiated Contact with Patient 11/02/12 [phone]      Chief Complaint  Patient presents with  . Rash     Patient is a 27 y.o. male presenting with rash. The history is provided by the patient.  Rash Location:  Finger, leg, face and hand Quality: itchiness   Severity:  Mild Onset quality:  Gradual Duration:  1 month Timing:  Constant Progression:  Worsening Context: animal contact   Worsened by:  Nothing tried Associated symptoms: no fever   pt reports rash for one month He was seen in the ED, given prescription but he reports [facility] pharmacist threw Rx away and gave him NIX He reports only minimal relief from NIX No tick bites No other complaints reporte Past Medical History  Diagnosis Date  . Asthma     Past Surgical History  Procedure Laterality Date  . Finger surgery      History reviewed. No pertinent family history.  History  Substance Use Topics  . Smoking status: Current Every Day Smoker -- 0.50 packs/day    Types: Cigarettes  . Smokeless tobacco: Not on file  . Alcohol Use: Yes     Comment: 1-2 times per week      Review of Systems  Constitutional: Negative for fever.  Skin: Positive for rash.    Allergies  Review of patient's allergies indicates no known allergies.  Home Medications   Current Outpatient Rx  Name  Route  Sig  Dispense  Refill  . albuterol (PROVENTIL HFA;VENTOLIN HFA) 108 (90 BASE) MCG/ACT inhaler   Inhalation   Inhale 2 puffs into the lungs every 6 (six) hours as needed for wheezing.         Marland Kitchen ibuprofen (ADVIL,MOTRIN) 200 MG tablet   Oral   Take 800 mg by mouth every 6 (six) hours as needed for pain.         Marland Kitchen permethrin (ELIMITE) 5 % cream      Apply to affected area once, repeat in 14 days if no improvement   60 g   0     BP 124/80  Pulse 85  Temp(Src) 98 F (36.7 C)  Resp 16  SpO2 100%  Physical Exam CONSTITUTIONAL: Well  developed/well nourished HEAD: Normocephalic/atraumatic EYES: EOMI ENMT: Mucous membranes moist NECK: supple no meningeal signs LUNGS: no apparent distress NEURO: Pt is awake/alert, moves all extremitiesx4 EXTREMITIES:full ROM SKIN: warm, color normal. Rash noted to hands/arms/legs and neck.  Rash c/w scabies.  No erythema or streaking.  No petechiae.  No lesion in mouth or conjunctiva PSYCH: no abnormalities of mood noted  ED Course  Procedures   1. Scabies       MDM  Nursing notes including past medical history and social history reviewed and considered in documentation  Suspect scabies Will place him on permethrin Pt already had negative RPR in April, doubt syphilis         Joya Gaskins, MD 11/02/12 0151

## 2012-11-02 NOTE — ED Notes (Signed)
The patient is AOx4 and comfortable with the discharge instructions. 

## 2012-11-11 ENCOUNTER — Encounter (HOSPITAL_COMMUNITY): Payer: Self-pay | Admitting: *Deleted

## 2012-11-11 ENCOUNTER — Emergency Department (HOSPITAL_COMMUNITY)
Admission: EM | Admit: 2012-11-11 | Discharge: 2012-11-11 | Disposition: A | Discharge status: Home / Self Care | Payer: Self-pay | Attending: Emergency Medicine | Admitting: Emergency Medicine

## 2012-11-11 DIAGNOSIS — Y929 Unspecified place or not applicable: Secondary | ICD-10-CM | POA: Insufficient documentation

## 2012-11-11 DIAGNOSIS — Y939 Activity, unspecified: Secondary | ICD-10-CM | POA: Insufficient documentation

## 2012-11-11 DIAGNOSIS — W57XXXA Bitten or stung by nonvenomous insect and other nonvenomous arthropods, initial encounter: Secondary | ICD-10-CM

## 2012-11-11 DIAGNOSIS — IMO0002 Reserved for concepts with insufficient information to code with codable children: Secondary | ICD-10-CM | POA: Insufficient documentation

## 2012-11-11 DIAGNOSIS — F17200 Nicotine dependence, unspecified, uncomplicated: Secondary | ICD-10-CM | POA: Insufficient documentation

## 2012-11-11 DIAGNOSIS — Z79899 Other long term (current) drug therapy: Secondary | ICD-10-CM | POA: Insufficient documentation

## 2012-11-11 DIAGNOSIS — J45909 Unspecified asthma, uncomplicated: Secondary | ICD-10-CM | POA: Insufficient documentation

## 2012-11-11 MED ORDER — CEPHALEXIN 500 MG PO CAPS: 500.0000 mg | ORAL_CAPSULE | Freq: Four times a day (QID) | ORAL | Status: AC

## 2012-11-11 NOTE — ED Notes (Signed)
Pt states that he was bit in between his rt fourth and fifth toes. Pt noted to have redness and swelling to toe. Pt states that he took some of his girlfriends antibiotics that have helped the swelling.

## 2012-11-11 NOTE — ED Provider Notes (Signed)
History     CSN: [id number]  Arrival date & time 11/11/12  1137   First MD Initiated Contact with Patient 11/11/12 1144      Chief Complaint  Patient presents with  . Rash    (Consider location/radiation/quality/duration/timing/severity/associated sxs/prior treatment) HPI Comments: Patient presents emergency department with chief complaint of tick bite. States that he noticed a tick several days ago between his fourth and fifth toes on his right foot. He states that he pulled off the tick, but noticed some redness and swelling around the site. He states that he took some of his girlfriends antibiotics, which helped significantly. He is no longer complaining of any swelling or redness. He denies any fevers, chills, nausea, vomiting, myalgias, arthralgias, or rashes. His pain is mild. Nothing makes his symptoms better or worse. Symptoms do not radiate.  The history is provided by the patient. No language interpreter was used.    Past Medical History  Diagnosis Date  . Asthma     Past Surgical History  Procedure Laterality Date  . Finger surgery      No family history on file.  History  Substance Use Topics  . Smoking status: Current Every Day Smoker -- 0.50 packs/day    Types: Cigarettes  . Smokeless tobacco: Not on file  . Alcohol Use: Yes     Comment: 1-2 times per week      Review of Systems  All other systems reviewed and are negative.    Allergies  Review of patient's allergies indicates no known allergies.  Home Medications   Current Outpatient Rx  Name  Route  Sig  Dispense  Refill  . albuterol (PROVENTIL HFA;VENTOLIN HFA) 108 (90 BASE) MCG/ACT inhaler   Inhalation   Inhale 2 puffs into the lungs every 6 (six) hours as needed for wheezing.         Marland Kitchen ibuprofen (ADVIL,MOTRIN) 200 MG tablet   Oral   Take 800 mg by mouth every 6 (six) hours as needed for pain.         Marland Kitchen permethrin (ELIMITE) 5 % cream      Apply to affected area once, repeat in 14  days if no improvement   60 g   0     BP 124/77  Pulse 82  Temp(Src) 97.8 F (36.6 C) (Oral)  Resp 16  SpO2 98%  Physical Exam  Nursing note and vitals reviewed. Constitutional: He is oriented to person, place, and time. He appears well-developed and well-nourished.  HENT:  Head: Normocephalic and atraumatic.  Eyes: Conjunctivae and EOM are normal.  Neck: Normal range of motion.  Cardiovascular: Normal rate.   Pulmonary/Chest: Effort normal.  Abdominal: He exhibits no distension.  Musculoskeletal: Normal range of motion.  Neurological: He is alert and oriented to person, place, and time.  Skin: Skin is dry.  Small red mark in between the fourth and fifth toes on the right foot, no surrounding cellulitis, erythema, purulence, or discharge  No rashes observed  Psychiatric: He has a normal mood and affect. His behavior is normal. Judgment and thought content normal.    ED Course  Procedures (including critical care time)  Labs Reviewed  ROCKY MTN SPOTTED FVR AB, IGG-BLOOD  ROCKY MTN SPOTTED FVR AB, IGM-BLOOD  B. BURGDORFI ANTIBODIES   No results found.   1. Insect bite       MDM  Patient with tick bite. He was also recently treated for scabies. I highly doubt tick exposure to R. and  S. for Lyme disease. The patient has not had fevers, chills, myalgias, or arthralgias. Does not have any rashes. As possible the patient could have experienced a mild cellulitis around the bite, which improved with his girlfriends antibiotics. I'm going to give the patient Keflex to go home with. The patient is very concerned about Lyme disease and [facility] spotted fever, so I will order respectively blood work. I informed the patient that the antibiotic is quite expensive, and he said that he would not be able to afford it. This is why we decided to blood work, that way if it is negative, he will not have to purchase doxycycline.        Roxy Horseman, PA-C 11/11/12 [phone]

## 2012-11-11 NOTE — ED Notes (Signed)
Discharged with instructions and patient verbalizes an understanding

## 2012-11-12 NOTE — ED Provider Notes (Signed)
Medical screening examination/treatment/procedure(s) were performed by non-physician practitioner and as supervising physician I was immediately available for consultation/collaboration.   Beautifull Cisar J. Darenda Fike, MD 11/12/12 0717 

## 2014-05-16 IMAGING — CR DG SHOULDER 2+V*R*
3 series · 3 of 3 positions shown · non-contrast
Comparison: None

CLINICAL DATA: shoulder pain

RIGHT SHOULDER - 2+ VIEW

[w shoulder ap internal righ]
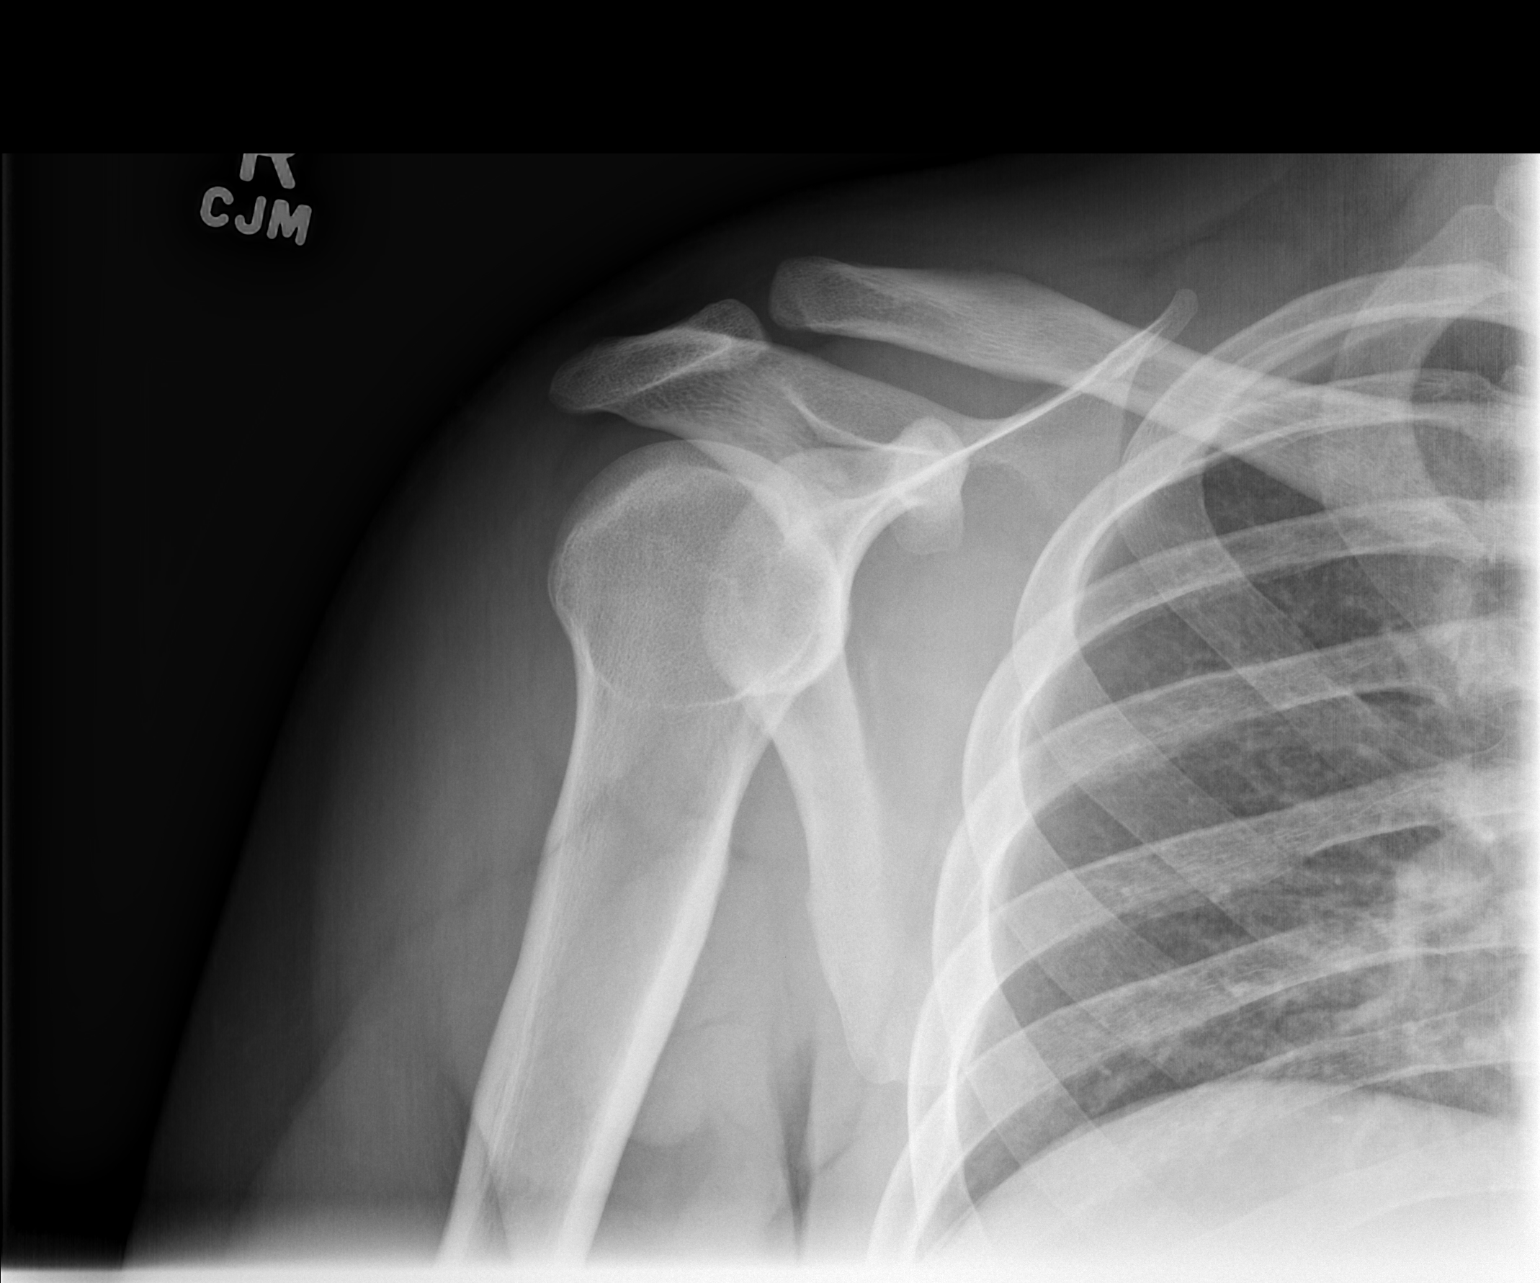

[w shoulder ap external righ]
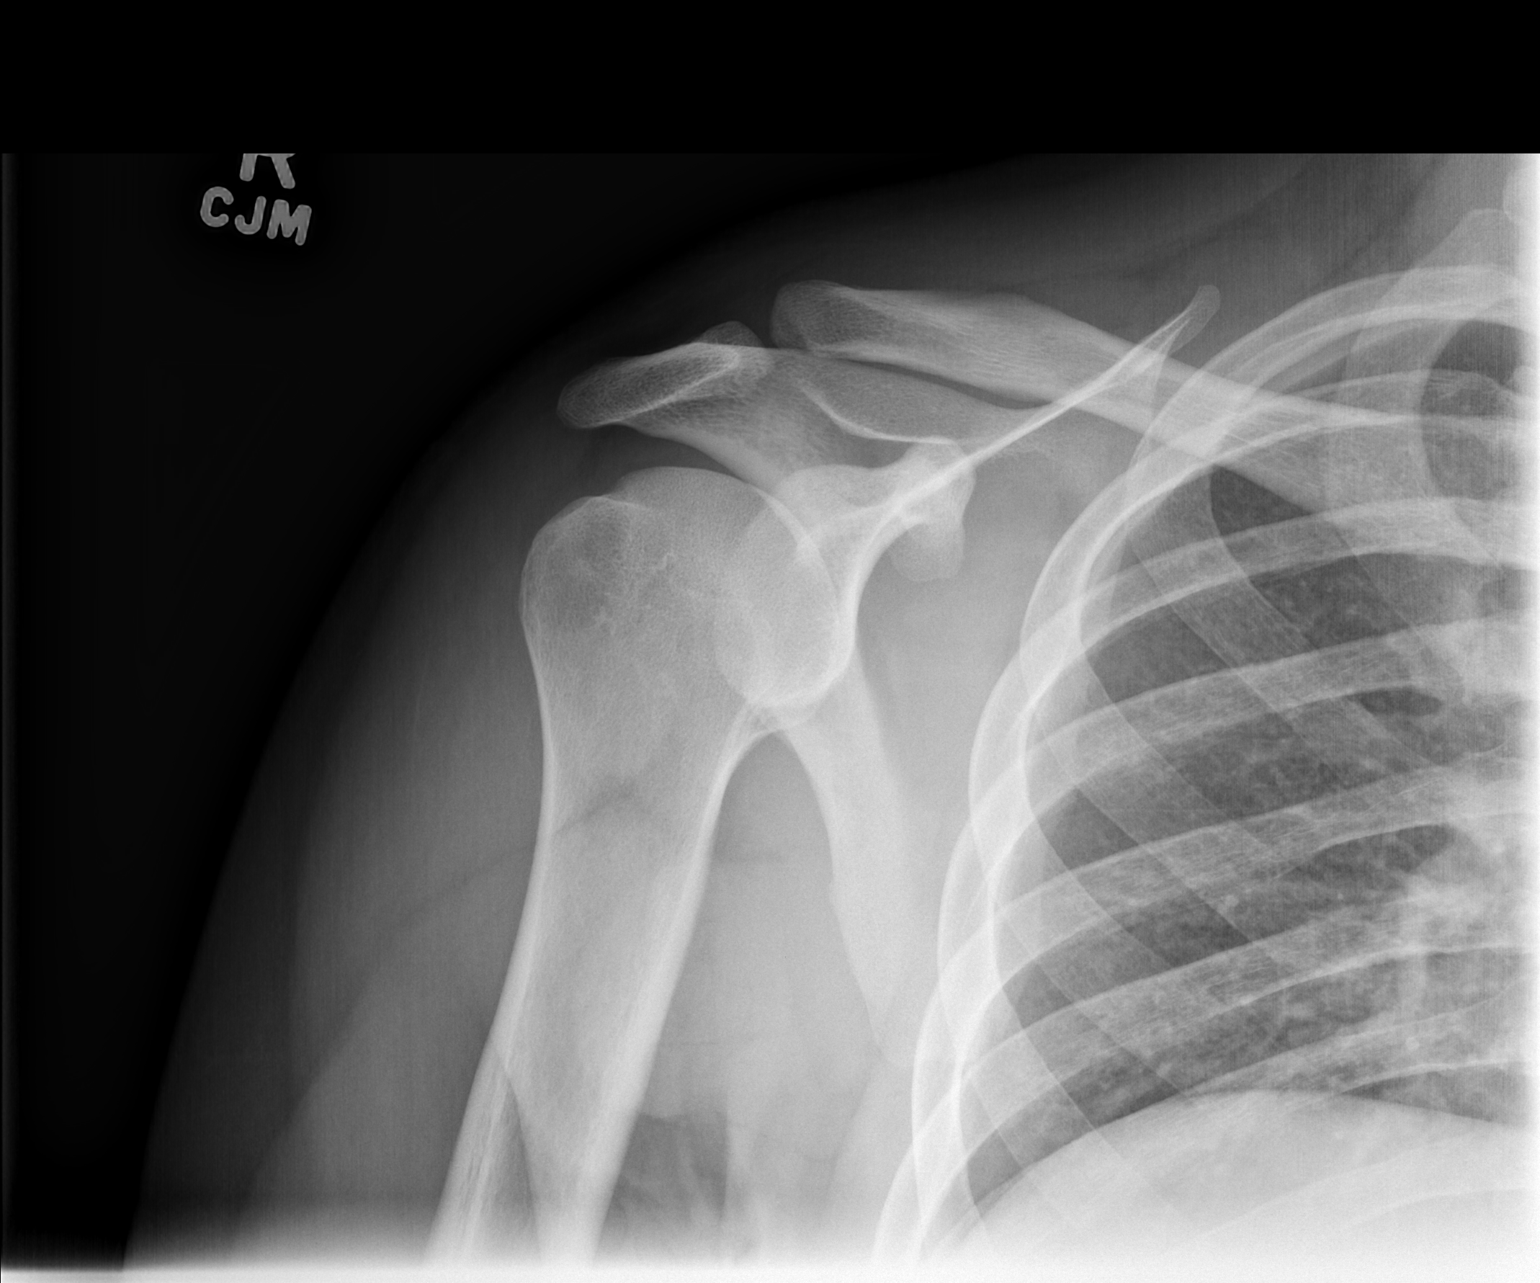

[w shoulder y view right]
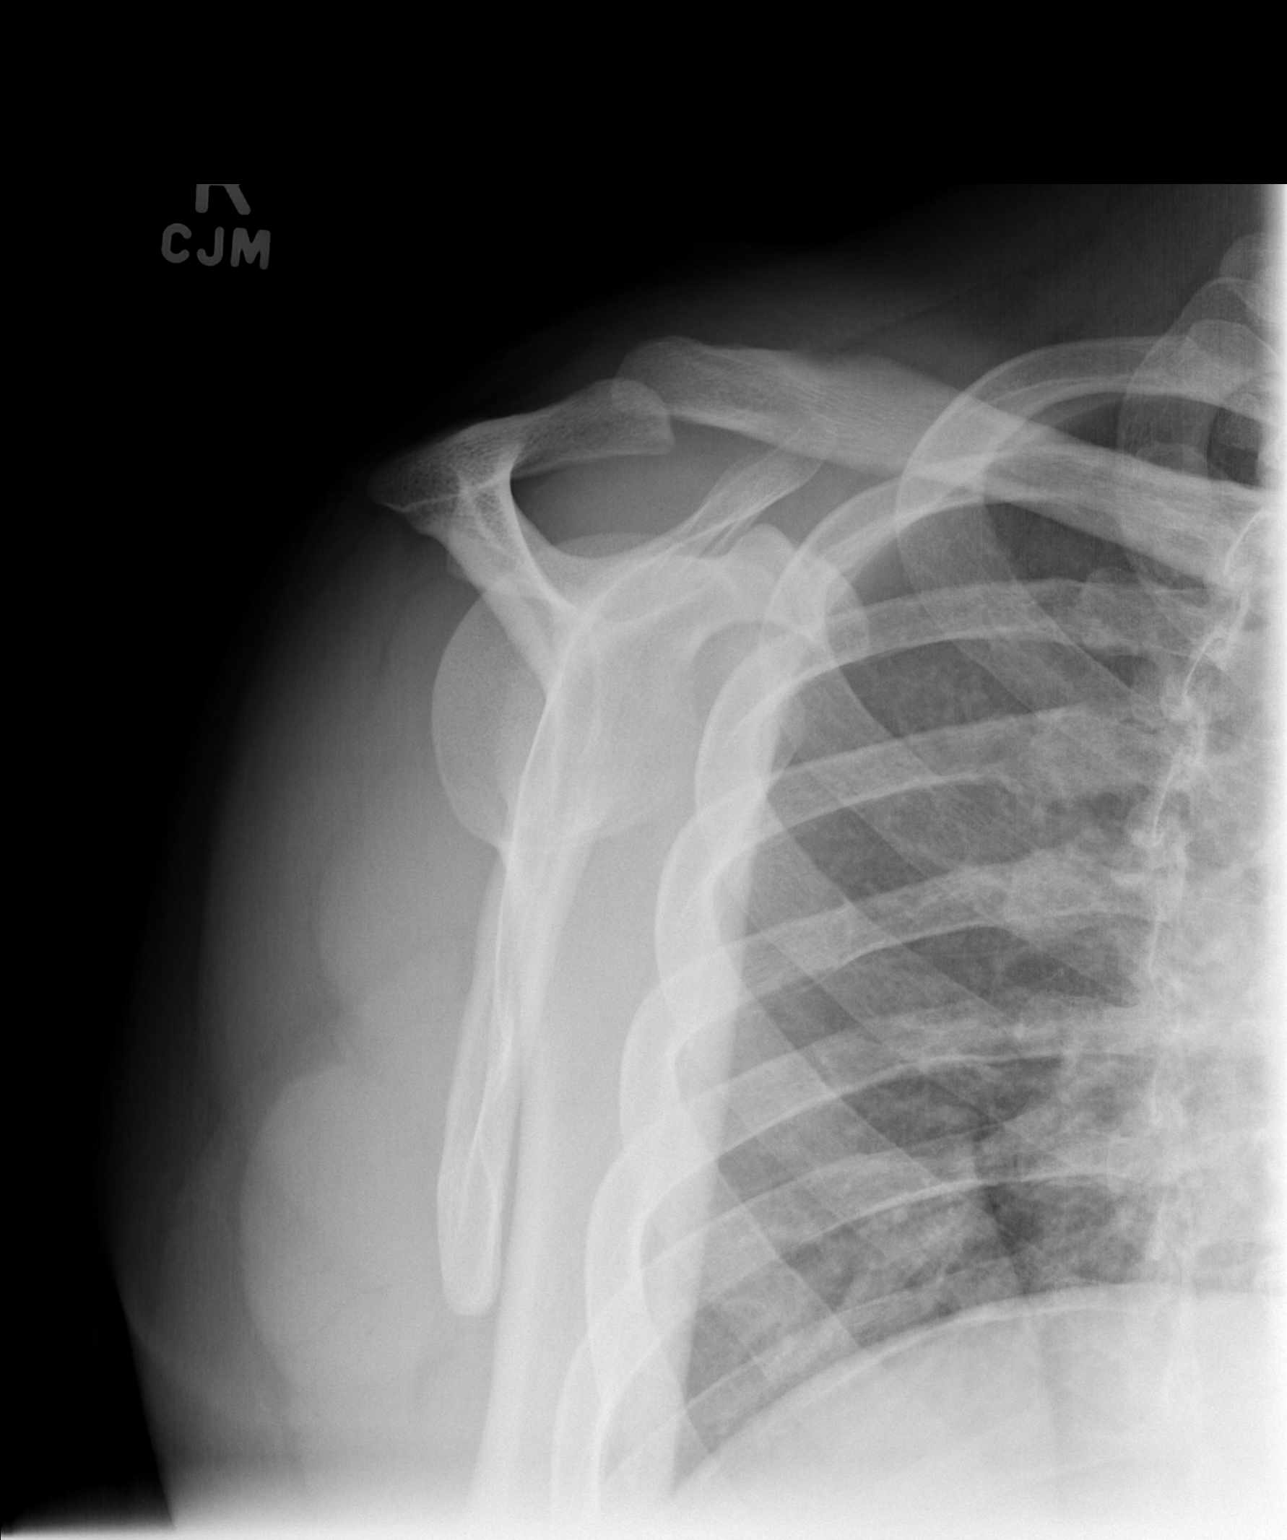

[3 of 3 positions shown; findings below may reference images not displayed]

FINDINGS: There is no evidence of fracture or dislocation.  There
is no evidence of arthropathy or other focal bone abnormality.
Soft tissues are unremarkable.
IMPRESSION: Negative exam.

## 2015-01-27 IMAGING — CR DG ANKLE COMPLETE 3+V*R*
3 series · 3 of 3 positions shown · non-contrast
Comparison: None.

CLINICAL DATA: Ankle pain and swelling.

RIGHT ANKLE - COMPLETE 3+ VIEW

[t ankle joint ap right]
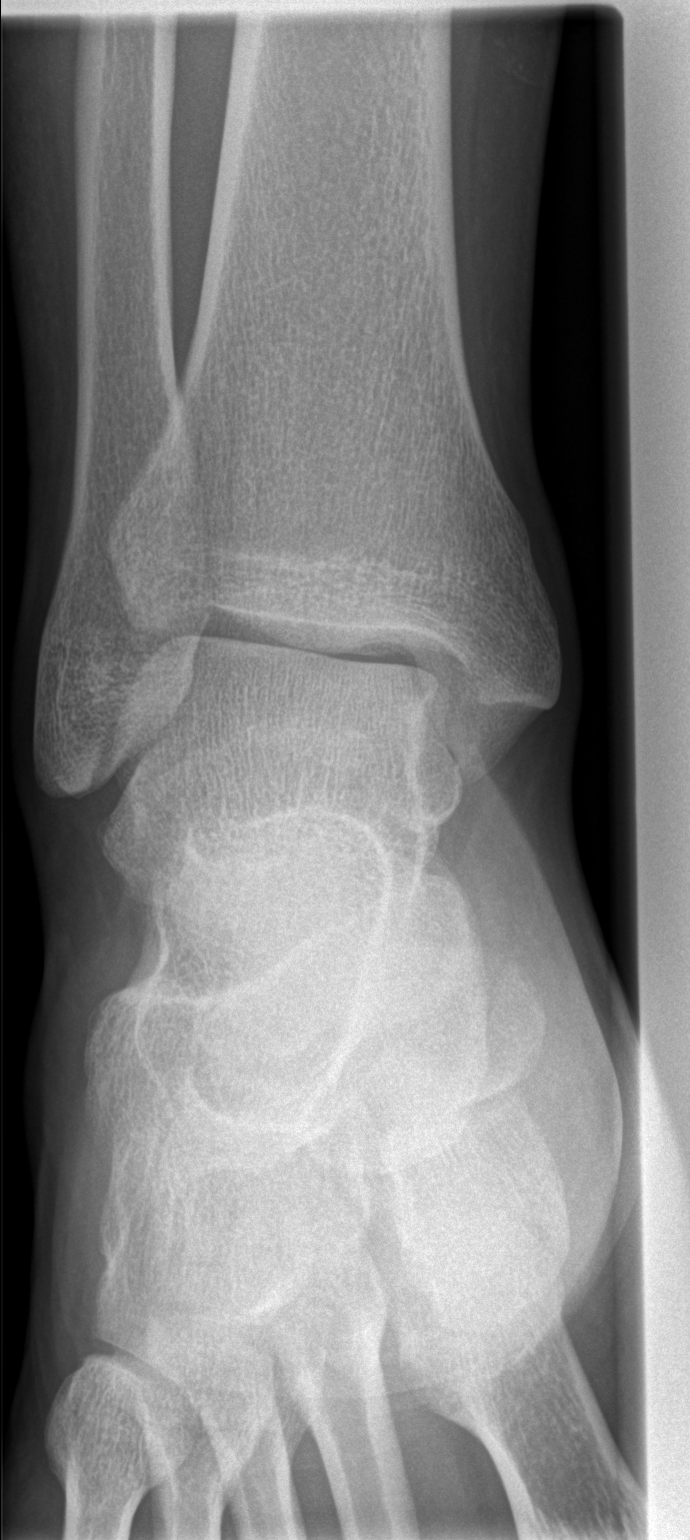

[t ankle joint oblique right]
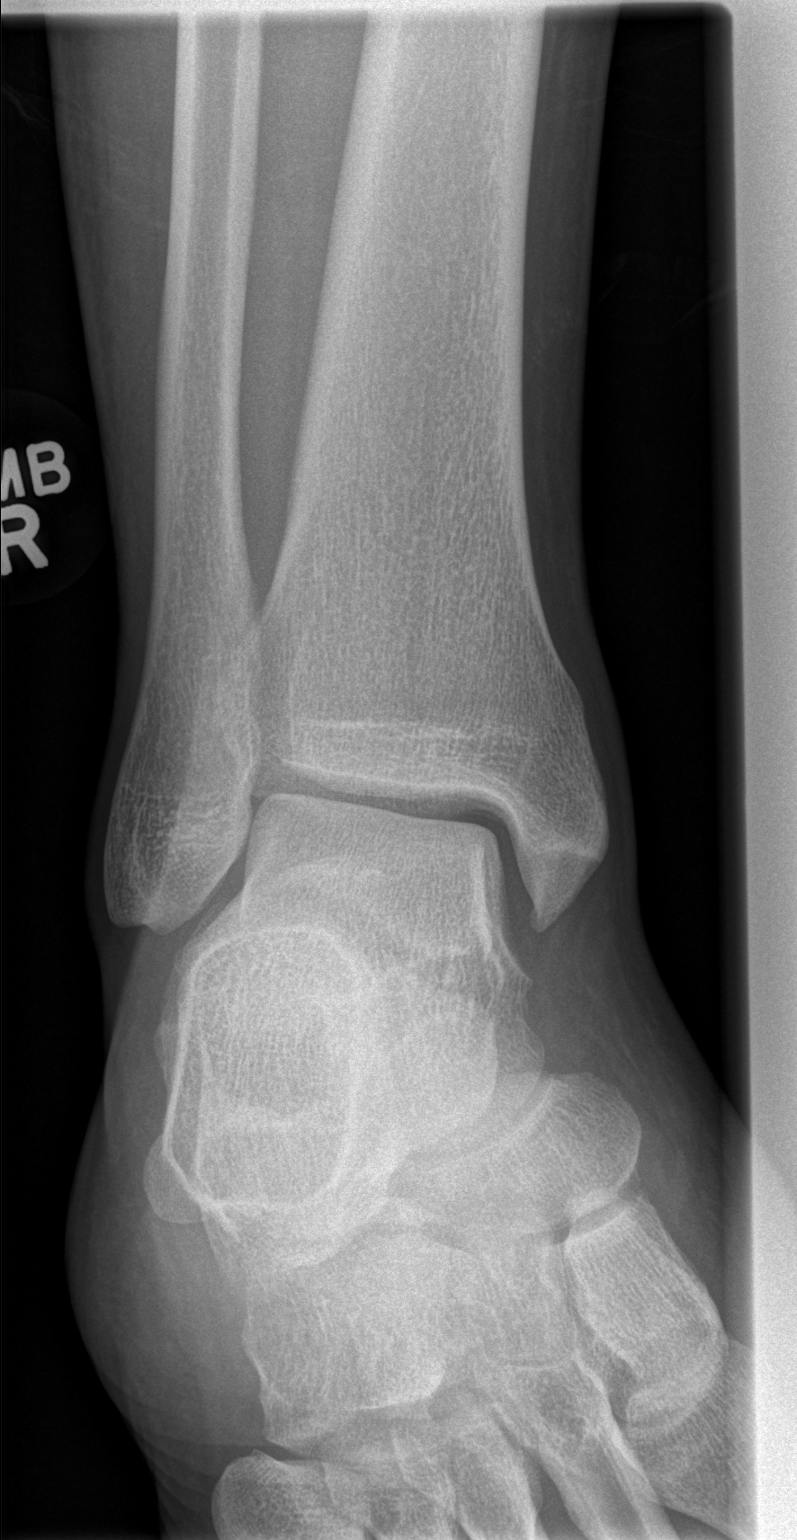

[t ankle joint lat right]
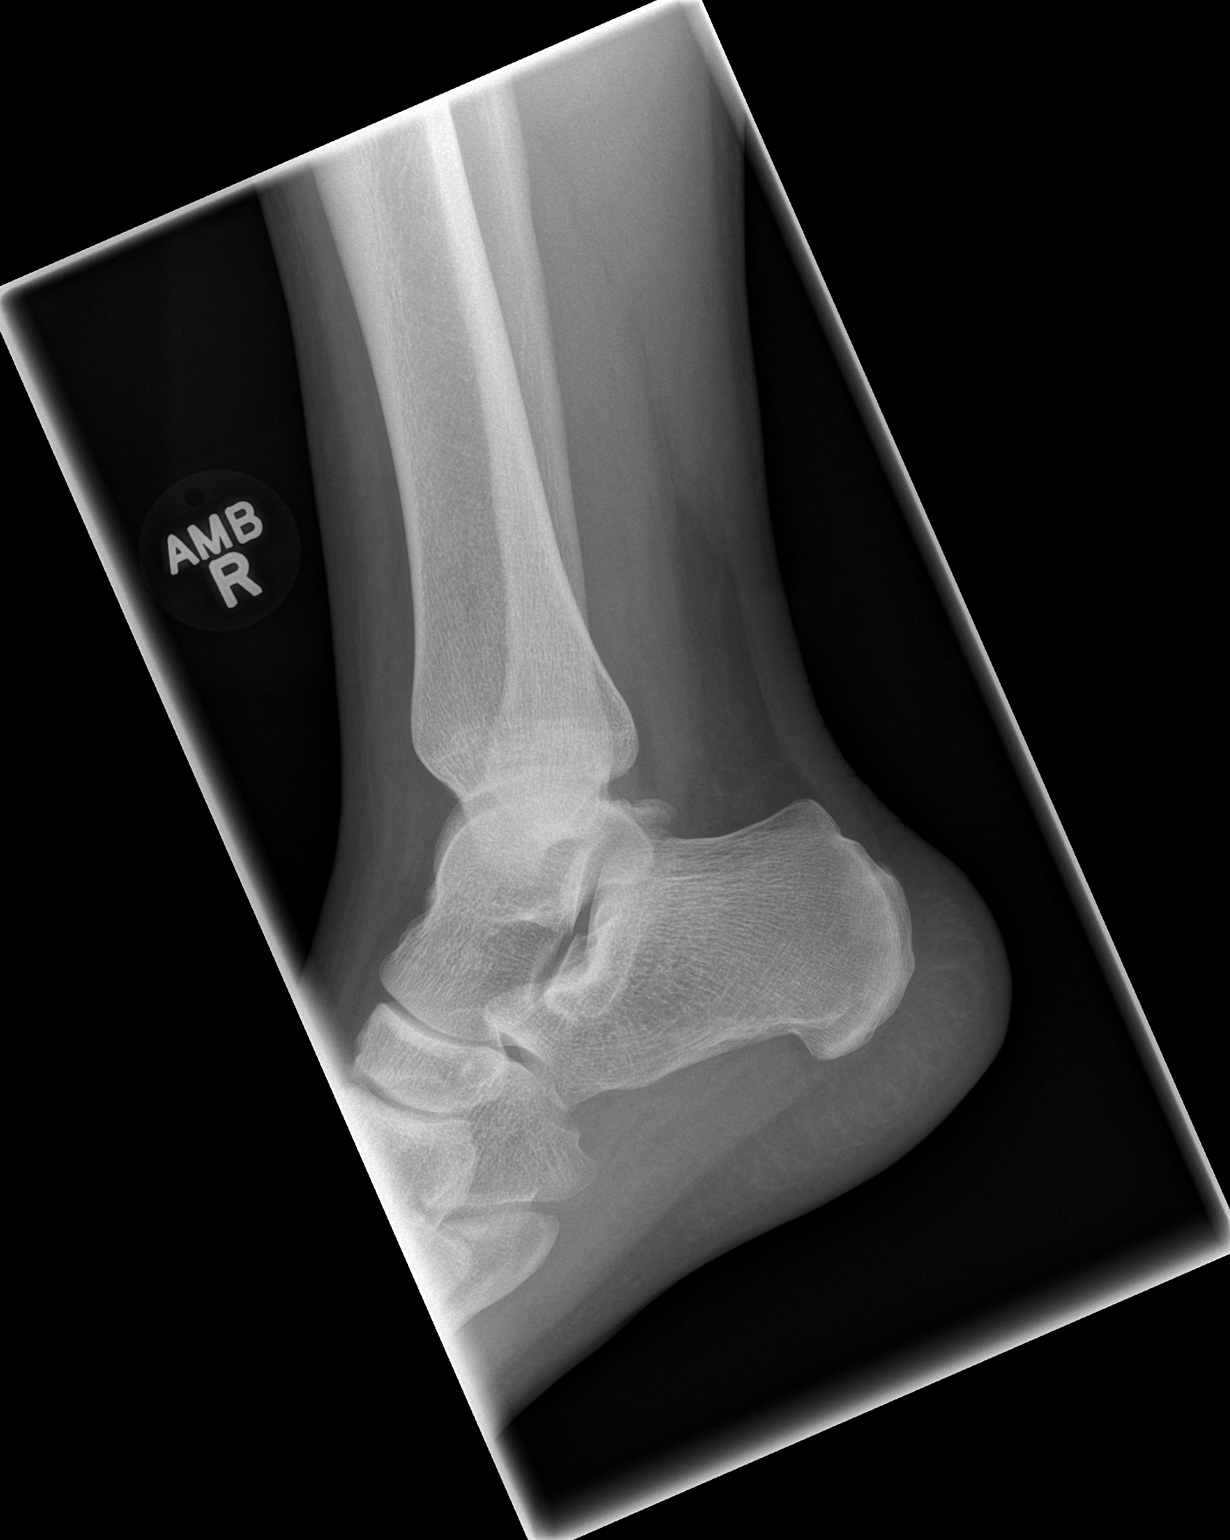

[3 of 3 positions shown; findings below may reference images not displayed]

FINDINGS: No fracture or dislocation is noted.  Joint spaces are
intact. No soft tissue abnormality is noted.
IMPRESSION: Normal right ankle.

## 2015-05-05 DIAGNOSIS — W3400XA Accidental discharge from unspecified firearms or gun, initial encounter: Secondary | ICD-10-CM

## 2015-05-05 HISTORY — DX: Accidental discharge from unspecified firearms or gun, initial encounter: W34.00XA

## 2015-05-26 ENCOUNTER — Emergency Department (HOSPITAL_COMMUNITY)
Admission: EM | Admit: 2015-05-26 | Discharge: 2015-05-26 | Disposition: A | Discharge status: Home / Self Care | Payer: Self-pay | Attending: Emergency Medicine | Admitting: Emergency Medicine

## 2015-05-26 ENCOUNTER — Encounter (HOSPITAL_COMMUNITY): Payer: Self-pay

## 2015-05-26 DIAGNOSIS — Z79899 Other long term (current) drug therapy: Secondary | ICD-10-CM | POA: Insufficient documentation

## 2015-05-26 DIAGNOSIS — Z760 Encounter for issue of repeat prescription: Secondary | ICD-10-CM | POA: Insufficient documentation

## 2015-05-26 DIAGNOSIS — J45909 Unspecified asthma, uncomplicated: Secondary | ICD-10-CM | POA: Insufficient documentation

## 2015-05-26 DIAGNOSIS — F17210 Nicotine dependence, cigarettes, uncomplicated: Secondary | ICD-10-CM | POA: Insufficient documentation

## 2015-05-26 DIAGNOSIS — G8911 Acute pain due to trauma: Secondary | ICD-10-CM | POA: Insufficient documentation

## 2015-05-26 DIAGNOSIS — Z792 Long term (current) use of antibiotics: Secondary | ICD-10-CM | POA: Insufficient documentation

## 2015-05-26 HISTORY — DX: Accidental discharge from unspecified firearms or gun, initial encounter: W34.00XA

## 2015-05-26 MED ORDER — OXYCODONE-ACETAMINOPHEN 5-325 MG PO TABS: 1.0000 | ORAL_TABLET | Freq: Four times a day (QID) | ORAL | Status: AC | PRN

## 2015-05-26 MED ORDER — OXYCODONE-ACETAMINOPHEN 5-325 MG PO TABS
ORAL_TABLET | ORAL | Status: AC
  Filled 2015-05-26: qty 1

## 2015-05-26 MED ORDER — OXYCODONE-ACETAMINOPHEN 5-325 MG PO TABS
1.0000 | ORAL_TABLET | Freq: Once | ORAL | Status: AC
  Administered 2015-05-26: 1 via ORAL
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
## 2015-05-26 NOTE — ED Notes (Signed)
See EDP notes 

## 2015-05-26 NOTE — Discharge Instructions (Signed)
Keep your follow up [facility]. Do not drive or do any activity that may cause injury while taking the narcotic as it will make you sleepy. You can take the ibuprofen but do not take more than prescribed and do not take it on an empty stomach.    Medicine Refill at the Emergency Department We have refilled your medicine today, but it is best for you to get refills through your primary health care provider's office. In the future, please plan ahead so you do not need to get refills from the emergency department. If the medicine we refilled was a maintenance medicine, you may have received only enough to get you by until you are able to see your regular health care provider.   This information is not intended to replace advice given to you by your health care provider. Make sure you discuss any questions you have with your health care provider.   Document Released: 10/01/2003 Document Revised: 07/05/2014 Document Reviewed: 09/21/2013 Elsevier Interactive Patient Education [employer]2016 [employer]
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
